# Patient Record
Sex: Male | Born: 1985 | Race: White | Hispanic: Yes | Marital: Single | Smoking: Never smoker
Health system: Southern US, Community
[De-identification: ages and names within clinical notes are randomized; demographics above are authoritative.]

## PROBLEM LIST (undated history)

## (undated) HISTORY — PX: ORIF FEMUR FRACTURE: SHX2119

---

## 2008-10-03 ENCOUNTER — Inpatient Hospital Stay (HOSPITAL_COMMUNITY): Admission: AC | Admit: 2008-10-03 | Discharge: 2008-10-10 | Payer: Self-pay

## 2008-10-07 ENCOUNTER — Ambulatory Visit: Payer: Self-pay | Admitting: *Deleted

## 2008-10-07 ENCOUNTER — Encounter (INDEPENDENT_AMBULATORY_CARE_PROVIDER_SITE_OTHER): Payer: Self-pay | Admitting: General Surgery

## 2009-01-15 ENCOUNTER — Encounter: Admission: RE | Admit: 2009-01-15 | Discharge: 2009-02-17 | Payer: Self-pay | Admitting: Orthopedic Surgery

## 2010-07-03 IMAGING — CT CT EXTREM LOW BILAT W/O CM
1 of 3 series · 14 of 32 positions shown, 19 images · non-contrast
Comparison: Radiographs obtained earlier today.
COMPARISON: Routine CT images obtained at the same time.

CLINICAL DATA: Intramedullary rod fixation of a left femur
fracture.  Clinical concern for determining comparison femur
lengths and rotation.

CT BILATERAL FEMURS WITHOUT CONTRAST
TECHNIQUE: Multidetector CT imaging of both femurs was performed
according to the standard protocol without intravenous contrast.
Multiplanar CT image reconstructions were also generated.
TECHNIQUE: 3-dimensional CT images were rendered by post-processing
of the original CT data at independent workstation.  The 3-
dimensional CT images were interpreted, and findings were reported
in the accompanying complete CT report for this study.

[Series 2: — · axial · 0.85mm/px · z∈[-498,+76]mm · 14 of 196 slices shown, 19 images]
[im 13/196  soft-tissue]
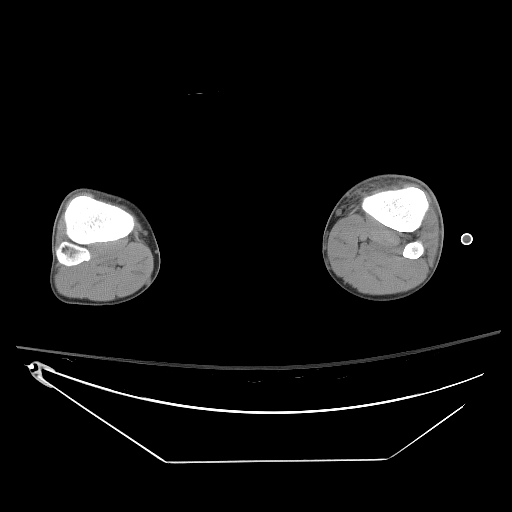
[im 13/196  bone]
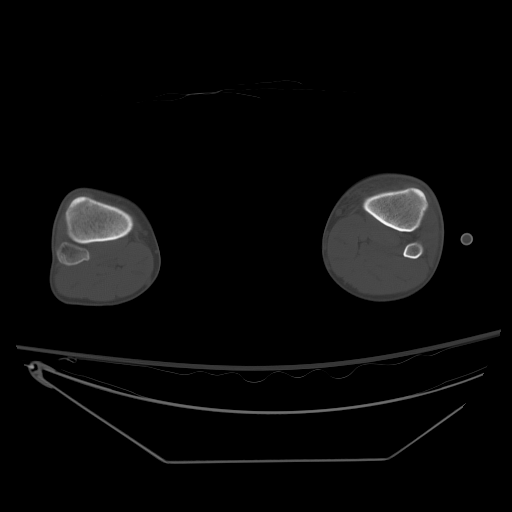
[im 25/196  soft-tissue]
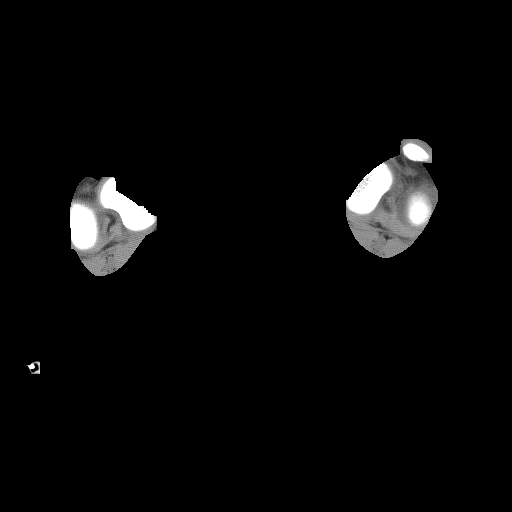
[im 37/196  soft-tissue]
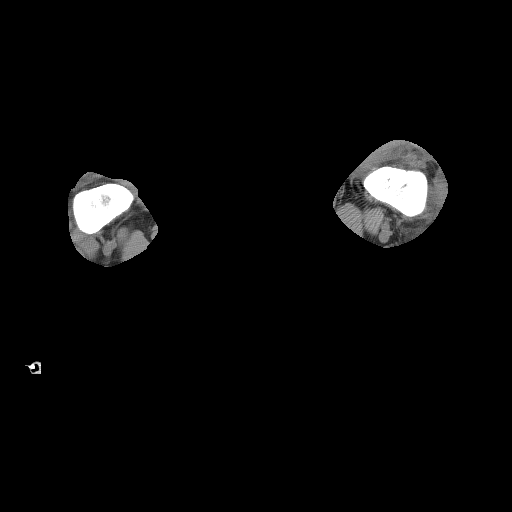
[im 61/196  soft-tissue]
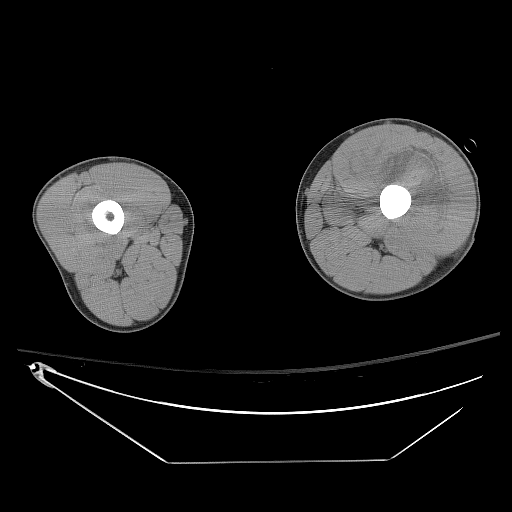
[im 74/196  soft-tissue]
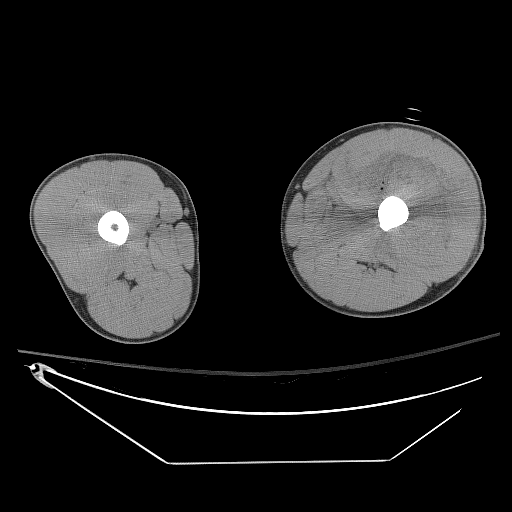
[im 86/196  soft-tissue]
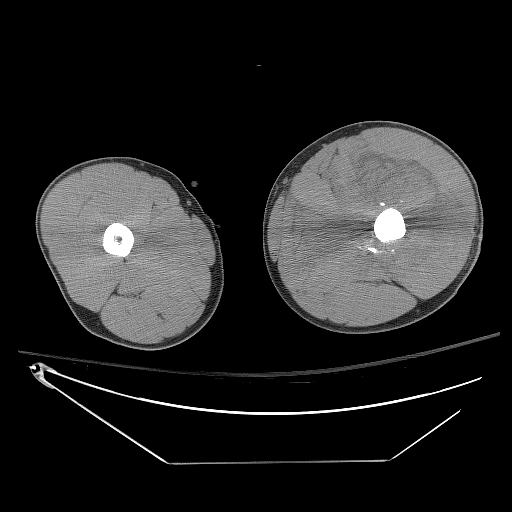
[im 98/196  soft-tissue]
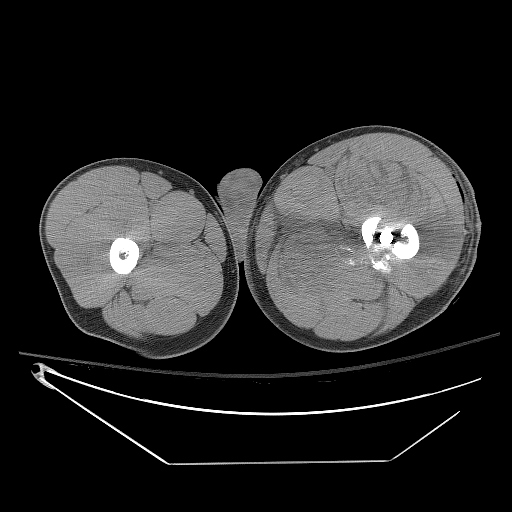
[im 110/196  soft-tissue]
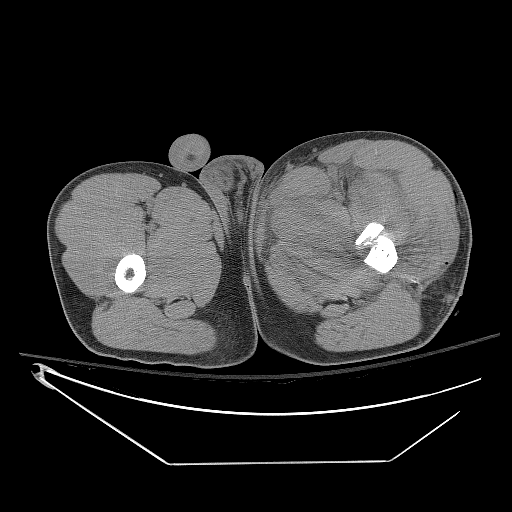
[im 122/196  soft-tissue]
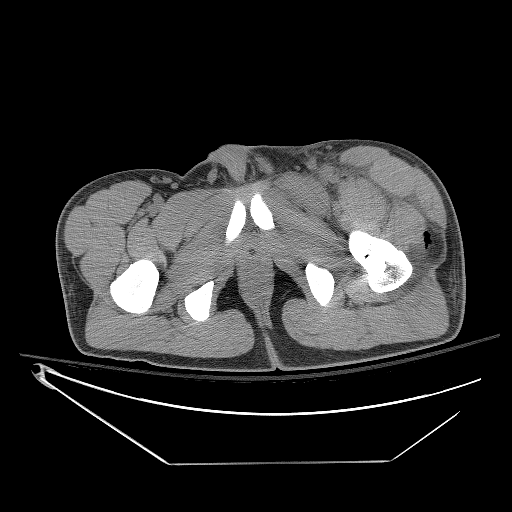
[im 122/196  bone]
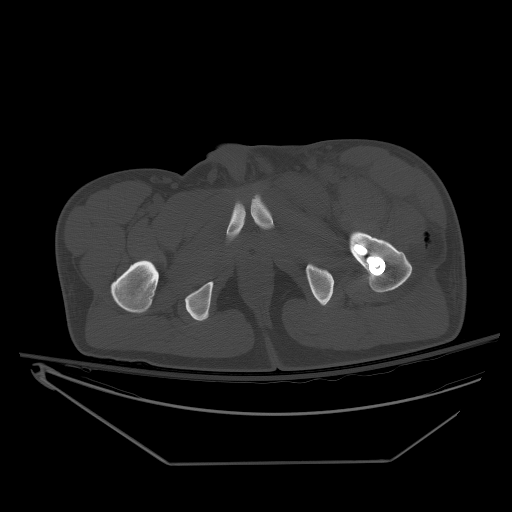
[im 135/196  soft-tissue]
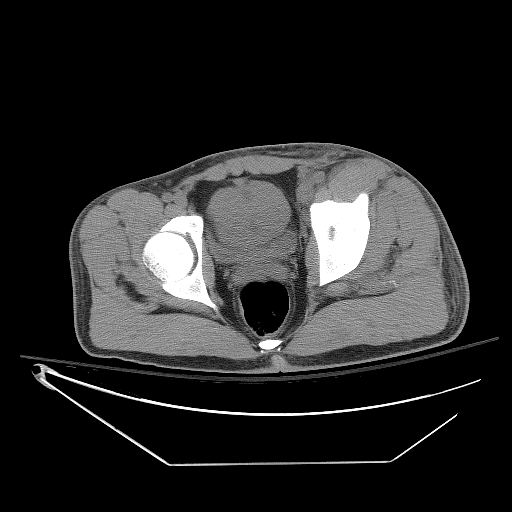
[im 147/196  lung]
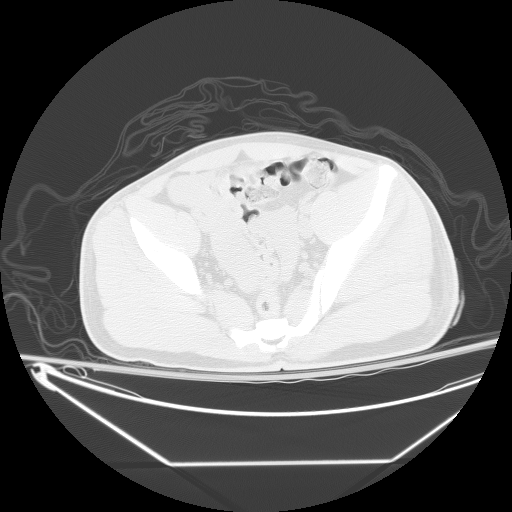
[im 159/196  soft-tissue]
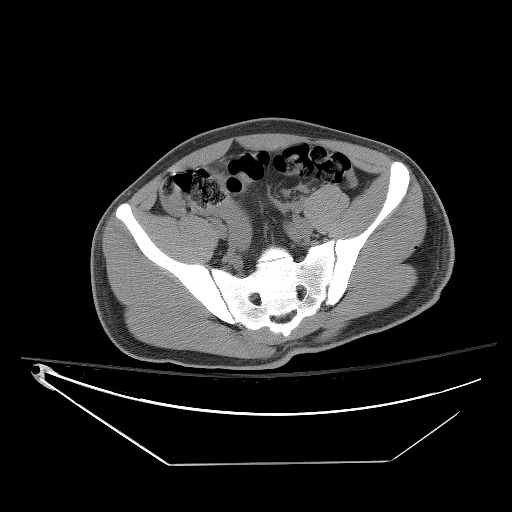
[im 159/196  lung]
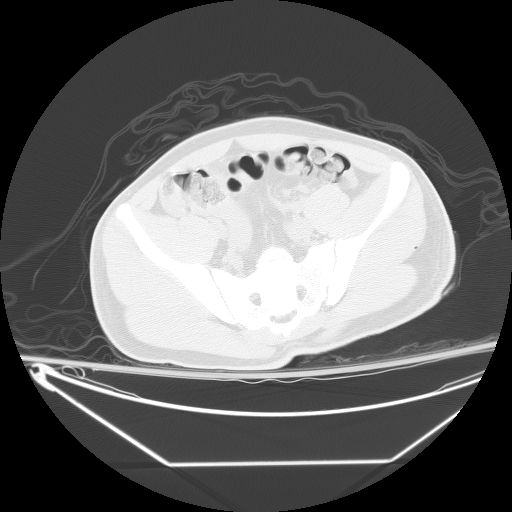
[im 171/196  soft-tissue]
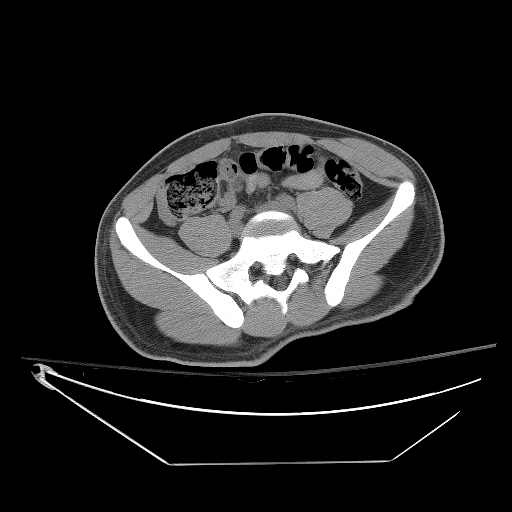
[im 171/196  lung]
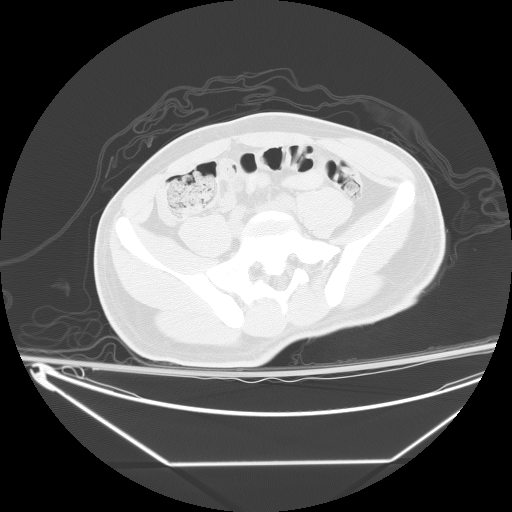
[im 183/196  soft-tissue]
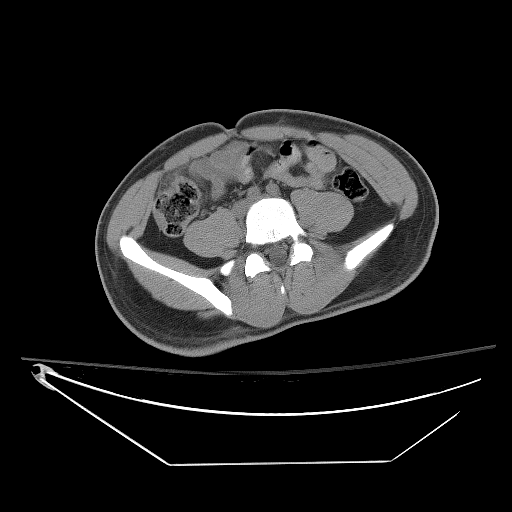
[im 183/196  lung]
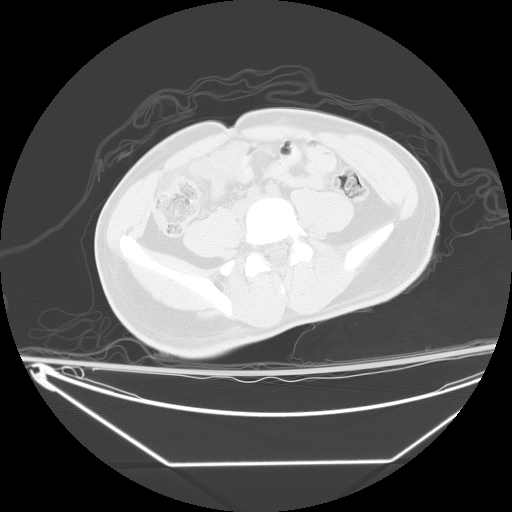

[14 of 32 positions shown; findings below may reference images not displayed]

FINDINGS: Previously demonstrated comminuted fracture of the
proximal shaft of the left femur with intramedullary rod fixation.
The major fragments are in anatomic position and alignment.  There
is anterior, posterior, medial and lateral displacement of the
middle fragments as well as linear calcific density in the adjacent
soft tissues.  No rotational component is demonstrated involving
the major fragments.  There is 25 degrees of external rotation of
both femurs.  The right femur measures 438.1 mm in length and the
left femur measures 420.2 mm in length.  There is also a
nondisplaced and nonangulated fracture extending through the left
greater trochanter and into the inferior femoral neck medially.
This is bridged by two screws.
IMPRESSION: Left femur fractures, with hardware fixation, as described above.
The left femur is 17.9 mm shorter than the right femur.  No
rotational component is seen involving the major fragments.

3-DIMENSIONAL CT IMAGE RENDERING AT INDEPENDENT WORKSTATION:
FINDINGS: See the above report.
IMPRESSION: See the above report.

## 2010-07-05 LAB — DIFFERENTIAL
Basophils Absolute: 0 10*3/uL (ref 0.0–0.1)
Basophils Absolute: 0 10*3/uL (ref 0.0–0.1)
Basophils Relative: 0 % (ref 0–1)
Eosinophils Absolute: 0 10*3/uL (ref 0.0–0.7)
Eosinophils Relative: 0 % (ref 0–5)
Eosinophils Relative: 3 % (ref 0–5)
Lymphocytes Relative: 11 % — ABNORMAL LOW (ref 12–46)
Lymphocytes Relative: 13 % (ref 12–46)

## 2010-07-05 LAB — CBC
HCT: 29.7 % — ABNORMAL LOW (ref 39.0–52.0)
HCT: 29.9 % — ABNORMAL LOW (ref 39.0–52.0)
HCT: 31.6 % — ABNORMAL LOW (ref 39.0–52.0)
Hemoglobin: 10.1 g/dL — ABNORMAL LOW (ref 13.0–17.0)
Hemoglobin: 10.9 g/dL — ABNORMAL LOW (ref 13.0–17.0)
Hemoglobin: 11.9 g/dL — ABNORMAL LOW (ref 13.0–17.0)
Hemoglobin: 7.9 g/dL — CL (ref 13.0–17.0)
Hemoglobin: 8.1 g/dL — ABNORMAL LOW (ref 13.0–17.0)
Hemoglobin: 8.2 g/dL — ABNORMAL LOW (ref 13.0–17.0)
MCHC: 34 g/dL (ref 30.0–36.0)
MCHC: 34.9 g/dL (ref 30.0–36.0)
MCV: 91.1 fL (ref 78.0–100.0)
MCV: 92.4 fL (ref 78.0–100.0)
MCV: 93 fL (ref 78.0–100.0)
MCV: 93.3 fL (ref 78.0–100.0)
Platelets: 146 10*3/uL — ABNORMAL LOW (ref 150–400)
Platelets: 158 10*3/uL (ref 150–400)
Platelets: 167 10*3/uL (ref 150–400)
Platelets: 247 10*3/uL (ref 150–400)
Platelets: 292 10*3/uL (ref 150–400)
RBC: 2.42 MIL/uL — ABNORMAL LOW (ref 4.22–5.81)
RBC: 2.43 MIL/uL — ABNORMAL LOW (ref 4.22–5.81)
RBC: 2.53 MIL/uL — ABNORMAL LOW (ref 4.22–5.81)
RBC: 2.62 MIL/uL — ABNORMAL LOW (ref 4.22–5.81)
RBC: 3.19 MIL/uL — ABNORMAL LOW (ref 4.22–5.81)
RBC: 3.43 MIL/uL — ABNORMAL LOW (ref 4.22–5.81)
RBC: 3.43 MIL/uL — ABNORMAL LOW (ref 4.22–5.81)
RDW: 12.3 % (ref 11.5–15.5)
RDW: 12.4 % (ref 11.5–15.5)
RDW: 12.5 % (ref 11.5–15.5)
RDW: 12.6 % (ref 11.5–15.5)
WBC: 11.1 10*3/uL — ABNORMAL HIGH (ref 4.0–10.5)
WBC: 18 10*3/uL — ABNORMAL HIGH (ref 4.0–10.5)
WBC: 7.4 10*3/uL (ref 4.0–10.5)
WBC: 8.1 10*3/uL (ref 4.0–10.5)
WBC: 8.3 10*3/uL (ref 4.0–10.5)
WBC: 9 10*3/uL (ref 4.0–10.5)

## 2010-07-05 LAB — CROSSMATCH

## 2010-07-05 LAB — GLUCOSE, CAPILLARY: Glucose-Capillary: 177 mg/dL — ABNORMAL HIGH (ref 70–99)

## 2010-07-05 LAB — PROTIME-INR
INR: 1 (ref 0.00–1.49)
INR: 1.1 (ref 0.00–1.49)
Prothrombin Time: 13.5 seconds (ref 11.6–15.2)
Prothrombin Time: 14.4 seconds (ref 11.6–15.2)

## 2010-07-05 LAB — POCT I-STAT, CHEM 8
Calcium, Ion: 1.05 mmol/L — ABNORMAL LOW (ref 1.12–1.32)
Glucose, Bld: 129 mg/dL — ABNORMAL HIGH (ref 70–99)
HCT: 48 % (ref 39.0–52.0)
Hemoglobin: 16.3 g/dL (ref 13.0–17.0)

## 2010-07-05 LAB — BASIC METABOLIC PANEL
BUN: 10 mg/dL (ref 6–23)
BUN: 14 mg/dL (ref 6–23)
BUN: 5 mg/dL — ABNORMAL LOW (ref 6–23)
CO2: 29 meq/L (ref 19–32)
Calcium: 7.9 mg/dL — ABNORMAL LOW (ref 8.4–10.5)
Calcium: 8.2 mg/dL — ABNORMAL LOW (ref 8.4–10.5)
Calcium: 8.3 mg/dL — ABNORMAL LOW (ref 8.4–10.5)
Chloride: 102 mEq/L (ref 96–112)
Chloride: 98 mEq/L (ref 96–112)
Creatinine, Ser: 0.76 mg/dL (ref 0.4–1.5)
Creatinine, Ser: 0.81 mg/dL (ref 0.4–1.5)
Creatinine, Ser: 0.83 mg/dL (ref 0.4–1.5)
Creatinine, Ser: 0.9 mg/dL (ref 0.4–1.5)
GFR calc Af Amer: >60 mL/min (ref >60)
GFR calc Af Amer: >60 mL/min (ref >60)
GFR calc non Af Amer: >60 mL/min (ref >60)
GFR calc non Af Amer: >60 mL/min (ref >60)
GFR calc non Af Amer: >60 mL/min (ref >60)
GFR calc non Af Amer: >60 mL/min (ref >60)
Glucose, Bld: 140 mg/dL — ABNORMAL HIGH (ref 70–99)
Glucose, Bld: 140 mg/dL — ABNORMAL HIGH (ref 70–99)
Glucose, Bld: 149 mg/dL — ABNORMAL HIGH (ref 70–99)
Potassium: 3.9 mEq/L (ref 3.5–5.1)
Potassium: 4 mEq/L (ref 3.5–5.1)

## 2010-07-05 LAB — POCT I-STAT 4, (NA,K, GLUC, HGB,HCT)
Glucose, Bld: 133 mg/dL — ABNORMAL HIGH (ref 70–99)
HCT: 33 % — ABNORMAL LOW (ref 39.0–52.0)
Hemoglobin: 11.6 g/dL — ABNORMAL LOW (ref 13.0–17.0)
Potassium: 6 meq/L — ABNORMAL HIGH (ref 3.5–5.1)
Potassium: 6.7 meq/L (ref 3.5–5.1)
Sodium: 133 meq/L — ABNORMAL LOW (ref 135–145)

## 2010-07-05 LAB — HEMOGLOBIN AND HEMATOCRIT, BLOOD
HCT: 30.4 % — ABNORMAL LOW (ref 39.0–52.0)
Hemoglobin: 10.4 g/dL — ABNORMAL LOW (ref 13.0–17.0)

## 2010-07-05 LAB — MRSA PCR SCREENING: MRSA by PCR: NEGATIVE

## 2010-07-05 LAB — LACTIC ACID, PLASMA: Lactic Acid, Venous: 3.8 mmol/L — ABNORMAL HIGH (ref 0.5–2.2)
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
## 2010-08-11 NOTE — Op Note (Signed)
NAMESELAH, ZELMAN NO.:  [id number]   MEDICAL RECORD NO.:  [id number]          PATIENT TYPE:  INP   LOCATION:  3307                         FACILITY:  [facility]   PHYSICIAN:  Doralee Albino. Carola Frost, M.D. DATE OF BIRTH:  [birth year]   DATE OF PROCEDURE:  10/08/2008  DATE OF DISCHARGE:                               OPERATIVE REPORT   PREOPERATIVE DIAGNOSES:  Open left femur fracture with 18 mm of  shortening, status post intramedullary nailing, and left femoral neck  fracture.   POSTOPERATIVE DIAGNOSES:  Open left femur fracture with 18 mm of  shortening, status post intramedullary nailing, and left femoral neck  fracture.   PROCEDURES:  1. Revision left femur nailing with removal of deep implant and      femoral lengthening using a femoral distractor.  2. Irrigation and debridement of open femur fracture with bone      grafting.  3. Open reduction and internal fixation of left femoral neck.   SURGEON:  Doralee Albino. Carola Frost, MD   ASSISTANT:  Mearl Latin, PA   ANESTHESIA:  General.   COMPLICATIONS:  None.   ESTIMATED BLOOD LOSS:  120 mL.   DISPOSITION:  To PACU.   CONDITION:  Stable.   BRIEF SUMMARY OF INDICATIONS OF PROCEDURE:  [name] is a 25-  year-old male, initially seen and evaluated on October 03, 2008, for a  severely comminuted open left femur fracture with associated  nondisplaced femoral neck fracture.  The patient underwent IM nailing  during which the femoral neck fracture was identified.  I had requested  a CT scanogram preoperatively to evaluate appropriate bone length, but  this test was suboptimal precluding accurate determination.  A second  technique using a femoral nail within the package over the intact femur  was used instead and concerns at the end of the case remained regarding  restoration of length as I was concerned about the possibility of  shortening because of the adjustment required for appropriate placement  of the lag  screws into the femoral head, which were different and had  been measured on the intact femur.  Consequently, a CT scanogram was  obtained to evaluate both the femoral neck fractures to see if any  additional fixation or reduction was required here as well as to  evaluate length and rotation of the femur.  This diagnosed 18 mm of  shortening of the femur with excellent alignment of the femoral neck  though some anterior gapping of the fracture site.  I discussed with the  patient and his family these findings, recommended return to the OR for  the following; additional internal fixation and compression of the  femoral neck fracture with a screw outside the nail that would allow for  compression, revision femoral nailing using an external fixator to  distract the fracture gap and restore appropriate length relocking  distally, and then lastly repeat irrigation and debridement of the open  femur fracture with bone grafting given an exceedingly large gap.  The  patient understood the risks and benefits as did his family and he  wished to proceed.  He  understood those risks to include failure to  prevent infection, need for further surgery, and multiple others.   BRIEF DESCRIPTION OF PROCEDURE:  [name] was administered Ancef  preoperatively, taken to the operating room where general anesthesia was  induced.  He was positioned in the lazy lateral position with bumps  under his pelvis and torso and shoulders.  The incisions were opened  distally and the most distal screw removed.  The external fixator pins  for the femoral distractor were then placed in the distal femur and in  the trochanteric region of the proximal femur.  As I began to distract  after withdrawing the other distal locks, no discernible motion occurred  at the fracture site.  I was concerned about fracturing the greater  trochanter because of the excessive force required and determined at  that point that I would need to  remove the lag screws in the femoral  head and placed the pin of the femoral distractor within the canal left  by the screw.  Prior to jeopardizing the reduction of the femoral neck,  then I chose to add additional fixation.   I reopened the lag screw incisions and I went anteriorly with the guide  pin for the 65 [employer] titanium cannulated screw passing the guide pin  over the nail anteriorly and into the femoral head.  This was measured a  depth of approximately 95 mm to 90 mm.  Screw was selected and drilled  and advanced into the femoral head obtaining nice compression.  I then  withdrew the more inferior of the two lag screws in the femoral head and  further tighten the femoral neck lag screw.  I then used the femoral  distractor with pins in the proximal femur and in the distal femur to  distract the fracture site.  I generated quite a bit of pressure and  then tapped on the mallet to disengage the nail internally resulting in  gaining an additional centimeter and half.  This process was repeated  again in order to get appropriate length such that the more proximal of  the two locking sites in the distal femur could be used to advance a  screw into and secure fixation in the most distal hole of the nail.  An  additional locking bolt was placed using perfect circle technique in the  more proximal hole after first releasing the distractor such that the  fracture gap contracted to the appropriate length.  This resulted in  increasing length of almost exactly 20 mm.  The wounds were irrigated  and then closed with 2-0 PDS and 3-0 nylon.   It should be noted that prior to removal of the hardware, I discussed in  the first paragraph I actually removed the sutures from the open wound  and retracted the soft tissues and performed another pulsatile  irrigation with 3 L of normal saline.  I did not identify any purulence  or other evidence of significant infection.  I did debride small  areas  of muscle subcu and skin as well as scraped some of the bone contents.  No major devascularized segments were identified in addition.  Following  the debridement, 20 mL Vitoss foam pack allograft was placed in and  around the femur anteriorly, posteriorly, and medially.   All wounds were irrigated once more.  The lag screw was placed in the  femoral head again in an attempt to get a little additional compression  and also the most  proximal lag screw was tightened as well again for  further compression across the neck.  A 2-0 PDS and then nylon was used  to the skin.  Sterile gently compressive dressing was applied.  The  patient's rotation and alignment looked excellent.  He was awakened from  anesthesia and transported to PACU in stable condition.  Dr. Jean Rosenthal  indicated to give him 2 units of packed cells given his blood loss  anemia coming into surgery, although the blood loss remained quite mild  intraoperatively with again around 120 mL.  Montez Morita, PA-C, assisted  me throughout the procedures and as necessary for each portion.  He  provided retraction, distractive force control, and stabilization of the  femoral distractor and assisted with simultaneous wound closure among  others.   PROGNOSIS:  [name] will be at touchdown weightbearing over the next  6-8 weeks with graduated weightbearing thereafter.  Given his open femur  extensive bone loss, he is at increased risk for infection and nonunion.  However, unrestricted range of motion of the hip and knee, and be on DVT  prophylaxis.      Doralee Albino. Carola Frost, M.D.  Electronically Signed     [facility]/MEDQ  D:  10/08/2008  T:  10/09/2008  Job:  [id number]

## 2015-01-22 ENCOUNTER — Other Ambulatory Visit: Payer: Self-pay | Admitting: Physician Assistant

## 2015-01-23 ENCOUNTER — Ambulatory Visit: Payer: Self-pay | Admitting: Physician Assistant

## 2015-01-23 ENCOUNTER — Encounter: Payer: Self-pay | Admitting: Physician Assistant

## 2015-01-23 VITALS — BP 114/80 | HR 63 | Temp 98.2°F | Wt 157.8 lb

## 2015-01-23 DIAGNOSIS — E785 Hyperlipidemia, unspecified: Secondary | ICD-10-CM

## 2015-01-23 LAB — LIPID PANEL
CHOLESTEROL: 160 mg/dL (ref 125–200)
HDL: 35 mg/dL — ABNORMAL LOW (ref >=40)
LDL CALC: 70 mg/dL (ref <130)
Total CHOL/HDL Ratio: 4.6 Ratio (ref <=5.0)
Triglycerides: 273 mg/dL — ABNORMAL HIGH (ref <150)
VLDL: 55 mg/dL — AB (ref <30)

## 2015-01-23 NOTE — Progress Notes (Signed)
   BP 114/80 mmHg  Pulse 63  Temp(Src) 98.2 F (36.8 C)  Wt 157 lb 12.8 oz (71.578 kg)  SpO2 99%   Subjective:    Patient ID: [name], male    DOB: 02-Apr-1985, 29 y.o.   MRN: [id number]  HPI: [name] is a 29 y.o. male presenting on 01/23/2015 for Hyperlipidemia   HPI   Chief Complaint  Patient presents with  . Hyperlipidemia    pt states he is feeling well. and went to the lab yesterday.     Relevant past medical, surgical, family and social history reviewed and updated as indicated. Interim medical history since our last visit reviewed. Allergies and medications reviewed and updated.  No current outpatient prescriptions on file.   Review of Systems  Constitutional: Negative for fever, chills, diaphoresis, appetite change, fatigue and unexpected weight change.  HENT: Negative for congestion, dental problem, drooling, ear pain, facial swelling, hearing loss, mouth sores, sneezing, sore throat, trouble swallowing and voice change.   Eyes: Negative for pain, discharge, redness, itching and visual disturbance.  Respiratory: Negative for cough, choking, shortness of breath and wheezing.   Cardiovascular: Negative for chest pain, palpitations and leg swelling.  Gastrointestinal: Negative for vomiting, abdominal pain, diarrhea, constipation and blood in stool.  Endocrine: Positive for cold intolerance. Negative for heat intolerance and polydipsia.  Genitourinary: Negative for dysuria, hematuria and decreased urine volume.  Musculoskeletal: Negative for back pain, arthralgias and gait problem.  Skin: Negative for rash.  Allergic/Immunologic: Negative for environmental allergies.  Neurological: Negative for seizures, syncope, light-headedness and headaches.  Hematological: Negative for adenopathy.  Psychiatric/Behavioral: Negative for suicidal ideas, dysphoric mood and agitation. The patient is not nervous/anxious.     Per HPI unless specifically indicated  above     Objective:    BP 114/80 mmHg  Pulse 63  Temp(Src) 98.2 F (36.8 C)  Wt 157 lb 12.8 oz (71.578 kg)  SpO2 99%  Wt Readings from Last 3 Encounters:  01/23/15 157 lb 12.8 oz (71.578 kg)    Physical Exam  Constitutional: He is oriented to person, place, and time. He appears well-developed and well-nourished.  HENT:  Head: Normocephalic and atraumatic.  Neck: Neck supple.  Cardiovascular: Normal rate and regular rhythm.   Pulmonary/Chest: Effort normal and breath sounds normal. He has no wheezes.  Abdominal: Soft. Bowel sounds are normal. There is no tenderness.  Musculoskeletal: He exhibits no edema.  Lymphadenopathy:    He has no cervical adenopathy.  Neurological: He is alert and oriented to person, place, and time.  Skin: Skin is warm and dry.  Psychiatric: He has a normal mood and affect. His behavior is normal.  Vitals reviewed.   Results for orders placed or performed in visit on 01/22/15  Lipid panel  Result Value Ref Range   Cholesterol 160 125 - 200 mg/dL   Triglycerides 409273 (H) <150 mg/dL   HDL 35 (L) >=81>=40 mg/dL   Total CHOL/HDL Ratio 4.6 <=5.0 Ratio   VLDL 55 (H) <30 mg/dL   LDL Cholesterol 70 <191<130 mg/dL      Assessment & Plan:   Encounter Diagnosis  Name Primary?  . Hyperlipemia Yes     Start fish oil 2 daily Lowfat diet F/u with recheck 6 mo

## 2015-01-23 NOTE — Patient Instructions (Signed)
Dieta restringida en grasas y colesterol (Fat and Cholesterol Restricted Diet) El exceso de grasas y colesterol en la dieta puede causar problemas de salud. Esta dieta lo ayudar a mantener las grasas y el colesterol en los niveles normales para evitar enfermarse. QU TIPOS DE GRASAS DEBO ELEGIR?  Elija grasas monosaturadas y polinsaturadas. Estas se encuentran en alimentos como el aceite de oliva, aceite de canola, semillas de lino, nueces, almendras y semillas.  Consuma ms grasas omega-3. Las mejores opciones incluyen salmn, caballa, sardinas, atn, aceite de lino y semillas de lino molidas.  Limite el consumo de grasas saturadas, que se encuentran en productos de origen animal, como carnes, mantequilla y crema. Tambin pueden estar en productos vegetales, como aceite de palma, de palmiste y de coco.   Evite los alimentos con aceites parcialmente hidrogenados. Estos contienen grasas trans. Entre los ejemplos de alimentos con grasas trans se incluyen margarinas en barra, algunas margarinas untables, galletas dulces o saladas y otros productos horneados. QU PAUTAS GENERALES DEBO SEGUIR?   Lea las etiquetas de los alimentos. Busque las palabras "grasas trans" y "grasas saturadas".  Al preparar una comida:  Llene la mitad del plato con verduras y ensaladas de hojas verdes.  Llene un cuarto del plato con cereales integrales. Busque la palabra "integral" en el primer lugar de la lista de ingredientes.  Llene un cuarto del plato con alimentos con protenas magras.  Limite las frutas a dos porciones por da. Elija frutas en lugar de jugos.  Coma ms alimentos con fibra soluble, por ejemplo, manzanas, brcoli, zanahorias, frijoles, guisantes y cebada. Trate de consumir de 20a 30g (gramos) de fibra por da.  Coma ms comidas caseras. Coma menos en los restaurantes y los bares.  Limite o evite el alcohol.  Limite los alimentos con alto contenido de almidn y azcar.  Limite el consumo  de alimentos fritos.  Cocine los alimentos sin frerlos. Las opciones de coccin ms adecuadas son hornear, hervir, grillar y asar a la parrilla.  Baje de peso si es necesario. Aunque pierda poco peso, esto puede ser importante para la salud general. Tambin puede ayudar a prevenir enfermedades como diabetes y enfermedad cardaca. QU ALIMENTOS PUEDO COMER? Cereales Cereales integrales, como los panes de salvado o integrales, las galletas, los cereales y las pastas. Avena sin endulzar, trigo, cebada, quinua o arroz integral. Tortillas de harina de maz o de salvado. Verduras Verduras frescas o congeladas (crudas, al vapor, asadas o grilladas). Ensaladas de hojas verdes. Frutas Frutas frescas, en conserva (en su jugo natural) o frutas congeladas. Carnes y otros productos con protenas Carne de res molida (al 85% o ms magra), carne de res de animales alimentados con pastos o carne de res sin la grasa. Pollo o pavo sin piel. Carne de pollo o de pavo molida. Cerdo sin la grasa. Todos los pescados y frutos de mar. Huevos. Porotos, guisantes o lentejas secos. Frutos secos o semillas sin sal. Frijoles secos o en lata sin sal. Lcteos Productos lcteos con bajo contenido de grasas, como leche descremada o al 1%, quesos reducidos en grasas o al 2%, ricota con bajo contenido de grasas o queso cottage, o yogur natural con bajo contenido de grasas. Grasas y aceites Margarinas untables que no contengan grasas trans. [place] y condimentos para ensaladas livianos o reducidos en grasas. [place]. Aceites de oliva, canola, ssamo o crtamo. Mantequilla natural de cacahuate o almendra (elija la que no tenga agregado de aceite o azcar). Los artculos mencionados arriba pueden no ser una lista completa   de las bebidas o los alimentos recomendados. Comunquese con el nutricionista para conocer ms opciones. QU ALIMENTOS NO SE RECOMIENDAN? Cereales Pan blanco. Pastas blancas. Arroz blanco. Pan de maz.  Bagels, pasteles y croissants. Galletas saladas que contengan grasas trans. Verduras Papas blancas. Maz. Verduras con crema o fritas. Verduras en salsa de queso. Frutas Frutas secas. Fruta enlatada en almbar liviano o espeso. Jugo de frutas. Carnes y otros productos con protenas Cortes de carne con grasa. Costillas, alas de pollo, tocineta, salchicha, mortadela, salame, chinchulines, tocino, perros calientes, salchichas alemanas y embutidos envasados. Hgado y otros rganos. Lcteos Leche entera o al 2%, crema, mezcla de leche y crema, y queso crema. Quesos enteros. Yogur entero o endulzado. Quesos con toda su grasa. Cremas no lcteas y coberturas batidas. Quesos procesados, quesos para untar o cuajadas. Dulces y postres Jarabe de maz, azcares, miel y melazas. Caramelos. Mermelada y jalea. Jarabe. Cereales endulzados. Galletas, pasteles, bizcochuelos, donas, muffins y helado. Grasas y aceites Mantequilla, margarina en barra, manteca de cerdo, grasa, mantequilla clarificada o grasa de tocino. Aceites de coco, de palmiste o de palma. Bebidas Alcohol. Bebidas endulzadas (como refrescos, limonadas y bebidas frutales o ponches). Los artculos mencionados arriba pueden no ser una lista completa de las bebidas y los alimentos que se deben evitar. Comunquese con el nutricionista para obtener ms informacin.   Esta informacin no tiene como fin reemplazar el consejo del mdico. Asegrese de hacerle al mdico cualquier pregunta que tenga.   Document Released: 03/15/2005 Document Revised: 04/05/2014 Elsevier Interactive Patient Education 2016 [employer]  

## 2015-07-24 ENCOUNTER — Ambulatory Visit: Payer: Self-pay | Admitting: Physician Assistant

## 2015-07-28 ENCOUNTER — Encounter: Payer: Self-pay | Admitting: Physician Assistant

## 2015-07-28 ENCOUNTER — Ambulatory Visit: Payer: Self-pay | Admitting: Physician Assistant

## 2015-07-28 VITALS — BP 110/70 | HR 96 | Temp 97.9°F | Ht 65.0 in | Wt 157.0 lb

## 2015-07-28 DIAGNOSIS — E785 Hyperlipidemia, unspecified: Secondary | ICD-10-CM

## 2015-07-28 LAB — COMPLETE METABOLIC PANEL WITH GFR
ALBUMIN: 4.6 g/dL (ref 3.6–5.1)
ALK PHOS: 54 U/L (ref 40–115)
ALT: 20 U/L (ref 9–46)
AST: 15 U/L (ref 10–40)
BILIRUBIN TOTAL: 0.6 mg/dL (ref 0.2–1.2)
BUN: 12 mg/dL (ref 7–25)
CALCIUM: 9.5 mg/dL (ref 8.6–10.3)
CO2: 27 mmol/L (ref 20–31)
CREATININE: 0.97 mg/dL (ref 0.60–1.35)
Chloride: 103 mmol/L (ref 98–110)
Glucose, Bld: 92 mg/dL (ref 65–99)
Potassium: 4.4 mmol/L (ref 3.5–5.3)
SODIUM: 141 mmol/L (ref 135–146)
TOTAL PROTEIN: 7 g/dL (ref 6.1–8.1)

## 2015-07-28 LAB — LIPID PANEL
CHOLESTEROL: 168 mg/dL (ref 125–200)
HDL: 37 mg/dL — AB (ref >=40)
LDL CALC: 91 mg/dL (ref <130)
TRIGLYCERIDES: 198 mg/dL — AB (ref <150)
Total CHOL/HDL Ratio: 4.5 Ratio (ref <=5.0)
VLDL: 40 mg/dL — ABNORMAL HIGH (ref <30)

## 2015-07-28 NOTE — Progress Notes (Signed)
   BP 110/70 mmHg  Pulse 96  Temp(Src) 97.9 F (36.6 C)  Ht 5\' 5"  (1.651 m)  Wt 157 lb (71.215 kg)  BMI 26.13 kg/m2  SpO2 97%   Subjective:    Patient ID: [name], male    DOB: 1985-04-28, 30 y.o.   MRN: [id number]  HPI: [name] is a 30 y.o. male presenting on 07/28/2015 for Hyperlipidemia   HPI   Pt doing well.  No compalints  Relevant past medical, surgical, family and social history reviewed and updated as indicated. Interim medical history since our last visit reviewed. Allergies and medications reviewed and updated.  Current outpatient prescriptions:  Marland Kitchen.  Multiple Vitamin (MULTIVITAMIN) capsule, Take 1 capsule by mouth daily., Disp: , Rfl:  .  Omega-3 Fatty Acids (FISH OIL PO), Take 1 capsule by mouth daily., Disp: , Rfl:    Review of Systems  Constitutional: Negative for fever, chills, diaphoresis, appetite change, fatigue and unexpected weight change.  HENT: Negative for congestion, dental problem, drooling, ear pain, facial swelling, hearing loss, mouth sores, sneezing, sore throat, trouble swallowing and voice change.   Eyes: Negative for pain, discharge, redness, itching and visual disturbance.  Respiratory: Negative for cough, choking, shortness of breath and wheezing.   Cardiovascular: Negative for chest pain, palpitations and leg swelling.  Gastrointestinal: Negative for vomiting, abdominal pain, diarrhea, constipation and blood in stool.  Endocrine: Negative for cold intolerance, heat intolerance and polydipsia.  Genitourinary: Negative for dysuria, hematuria and decreased urine volume.  Musculoskeletal: Negative for back pain, arthralgias and gait problem.  Skin: Negative for rash.  Allergic/Immunologic: Negative for environmental allergies.  Neurological: Negative for seizures, syncope, light-headedness and headaches.  Hematological: Negative for adenopathy.  Psychiatric/Behavioral: Negative for suicidal ideas, dysphoric mood and agitation.  The patient is not nervous/anxious.     Per HPI unless specifically indicated above     Objective:    BP 110/70 mmHg  Pulse 96  Temp(Src) 97.9 F (36.6 C)  Ht 5\' 5"  (1.651 m)  Wt 157 lb (71.215 kg)  BMI 26.13 kg/m2  SpO2 97%  Wt Readings from Last 3 Encounters:  07/28/15 157 lb (71.215 kg)  01/23/15 157 lb 12.8 oz (71.578 kg)    Physical Exam  Constitutional: He is oriented to person, place, and time. He appears well-developed and well-nourished.  HENT:  Head: Normocephalic and atraumatic.  Neck: Neck supple.  Cardiovascular: Normal rate and regular rhythm.   Pulmonary/Chest: Effort normal and breath sounds normal. He has no wheezes.  Abdominal: Soft. Bowel sounds are normal. There is no hepatosplenomegaly. There is no tenderness.  Musculoskeletal: He exhibits no edema.  Lymphadenopathy:    He has no cervical adenopathy.  Neurological: He is alert and oriented to person, place, and time.  Skin: Skin is warm and dry.  Psychiatric: He has a normal mood and affect. His behavior is normal.  Vitals reviewed.       Assessment & Plan:   Encounter Diagnosis  Name Primary?  . Hyperlipidemia Yes    -get labs drawn- will call with results -Counseled on regular exercise to help lipids -F/u 6 month.  RTO sooner prn

## 2016-01-28 ENCOUNTER — Ambulatory Visit: Payer: Self-pay | Admitting: Physician Assistant

## 2016-02-05 ENCOUNTER — Encounter: Payer: Self-pay | Admitting: Physician Assistant
